# Patient Record
Sex: Female | Born: 2000 | Race: White | Hispanic: No | Marital: Single | Smoking: Never smoker
Health system: Southern US, Community
[De-identification: ages and names within clinical notes are randomized; demographics above are authoritative.]

## PROBLEM LIST (undated history)

## (undated) DIAGNOSIS — L709 Acne, unspecified: Secondary | ICD-10-CM

## (undated) DIAGNOSIS — N921 Excessive and frequent menstruation with irregular cycle: Secondary | ICD-10-CM

## (undated) HISTORY — DX: Excessive and frequent menstruation with irregular cycle: N92.1

## (undated) HISTORY — DX: Acne, unspecified: L70.9

## (undated) HISTORY — PX: WISDOM TOOTH EXTRACTION: SHX21

---
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
## 2022-05-18 ENCOUNTER — Telehealth: Payer: Self-pay

## 2022-05-18 NOTE — Telephone Encounter (Signed)
Patient had contacted office with concerns of UTI like symptoms that has been present <24hrs. In message patient stated that she no longer has health insurance and wanted to be prescribed medication without being seen. I contacted patient back who reported symptoms of urinary urgency and frequency. I advised patient that I could place her on nurse schedule so she would not be charged for office visit but I would need for her to come in to drop off a sample to be tested and sent for culture if needed before she is treated. Patient declined coming in office for nurse visit. Patient stated that if symptoms persisted over weekend she will go to urgent care or give our office a call back  on Monday. KW

## 2022-07-31 DIAGNOSIS — R112 Nausea with vomiting, unspecified: Secondary | ICD-10-CM | POA: Diagnosis not present

## 2022-08-01 DIAGNOSIS — R112 Nausea with vomiting, unspecified: Secondary | ICD-10-CM | POA: Diagnosis not present

## 2022-08-28 DIAGNOSIS — M9903 Segmental and somatic dysfunction of lumbar region: Secondary | ICD-10-CM | POA: Diagnosis not present

## 2022-08-28 DIAGNOSIS — M5451 Vertebrogenic low back pain: Secondary | ICD-10-CM | POA: Diagnosis not present

## 2022-08-28 DIAGNOSIS — M9905 Segmental and somatic dysfunction of pelvic region: Secondary | ICD-10-CM | POA: Diagnosis not present

## 2022-08-28 DIAGNOSIS — M9904 Segmental and somatic dysfunction of sacral region: Secondary | ICD-10-CM | POA: Diagnosis not present

## 2022-09-04 DIAGNOSIS — M5451 Vertebrogenic low back pain: Secondary | ICD-10-CM | POA: Diagnosis not present

## 2022-09-04 DIAGNOSIS — M9904 Segmental and somatic dysfunction of sacral region: Secondary | ICD-10-CM | POA: Diagnosis not present

## 2022-09-04 DIAGNOSIS — M9905 Segmental and somatic dysfunction of pelvic region: Secondary | ICD-10-CM | POA: Diagnosis not present

## 2022-09-04 DIAGNOSIS — M9903 Segmental and somatic dysfunction of lumbar region: Secondary | ICD-10-CM | POA: Diagnosis not present

## 2022-10-08 DIAGNOSIS — Z3202 Encounter for pregnancy test, result negative: Secondary | ICD-10-CM | POA: Diagnosis not present

## 2022-10-08 DIAGNOSIS — L700 Acne vulgaris: Secondary | ICD-10-CM | POA: Diagnosis not present

## 2022-10-08 NOTE — Progress Notes (Deleted)
    Pcp, No   No chief complaint on file.   HPI:      [name] is a 21 y.o. G0P0000 whose LMP was No LMP recorded. (Menstrual status: IUD)., presents today for ***  Hx of staph saprophyticus 4/22  Patient Active Problem List   Diagnosis Date Noted   Chronic UTI (urinary tract infection) 06/01/2021   Anxiety 11/26/2019   Menometrorrhagia 07/03/2018    Past Surgical History:  Procedure Laterality Date   WISDOM TOOTH EXTRACTION      Family History  Problem Relation Age of Onset   Pancreatic cancer Paternal Grandmother        41s   Colon cancer Paternal Grandmother     Social History   Socioeconomic History   Marital status: Single    Spouse name: Not on file   Number of children: Not on file   Years of education: Not on file   Highest education level: Not on file  Occupational History   Not on file  Tobacco Use   Smoking status: Never   Smokeless tobacco: Never  Vaping Use   Vaping Use: Some days  Substance and Sexual Activity   Alcohol use: Never   Drug use: Never   Sexual activity: Yes    Birth control/protection: I.U.D.    Comment: Kyleena  Other Topics Concern   Not on file  Social History Narrative   Not on file   Social Determinants of Health   Financial Resource Strain: Not on file  Food Insecurity: Not on file  Transportation Needs: Not on file  Physical Activity: Not on file  Stress: Not on file  Social Connections: Not on file  Intimate Partner Violence: Not on file    Outpatient Medications Prior to Visit  Medication Sig Dispense Refill   benzonatate (TESSALON) 100 MG capsule Take 1 capsule (100 mg total) by mouth 3 (three) times daily as needed for cough. 21 capsule 0   levonorgestrel (KYLEENA) 19.5 MG IUD 1 Intra Uterine Device (1 each total) by Intrauterine route once for 1 dose. 1 Intra Uterine Device 0   lidocaine (XYLOCAINE) 2 % solution Use as directed 15 mLs in the mouth or throat as needed for mouth pain. 100  mL 0   No facility-administered medications prior to visit.      ROS:  Review of Systems BREAST: No symptoms   OBJECTIVE:   Vitals:  There were no vitals taken for this visit.  Physical Exam  Results: No results found for this or any previous visit (from the past 24 hour(s)).   Assessment/Plan: No diagnosis found.    No orders of the defined types were placed in this encounter.     No follow-ups on file.  Jenai Scaletta B. Brett Soza, PA-C 10/08/2022 6:45 PM

## 2022-10-09 ENCOUNTER — Other Ambulatory Visit (HOSPITAL_COMMUNITY)
Admission: RE | Admit: 2022-10-09 | Discharge: 2022-10-09 | Disposition: A | Discharge status: Home / Self Care | Payer: BC Managed Care – PPO | Source: Ambulatory Visit | Attending: Obstetrics and Gynecology | Admitting: Obstetrics and Gynecology

## 2022-10-09 ENCOUNTER — Ambulatory Visit (INDEPENDENT_AMBULATORY_CARE_PROVIDER_SITE_OTHER): Payer: BC Managed Care – PPO | Admitting: Certified Nurse Midwife

## 2022-10-09 ENCOUNTER — Encounter: Payer: Self-pay | Admitting: Certified Nurse Midwife

## 2022-10-09 ENCOUNTER — Ambulatory Visit: Payer: BC Managed Care – PPO | Admitting: Obstetrics and Gynecology

## 2022-10-09 VITALS — BP 120/81 | HR 93 | Resp 16 | Wt 126.7 lb

## 2022-10-09 DIAGNOSIS — N898 Other specified noninflammatory disorders of vagina: Secondary | ICD-10-CM | POA: Insufficient documentation

## 2022-10-09 DIAGNOSIS — R300 Dysuria: Secondary | ICD-10-CM

## 2022-10-09 DIAGNOSIS — N912 Amenorrhea, unspecified: Secondary | ICD-10-CM

## 2022-10-09 DIAGNOSIS — Z3202 Encounter for pregnancy test, result negative: Secondary | ICD-10-CM | POA: Diagnosis not present

## 2022-10-09 DIAGNOSIS — Z113 Encounter for screening for infections with a predominantly sexual mode of transmission: Secondary | ICD-10-CM

## 2022-10-09 DIAGNOSIS — R399 Unspecified symptoms and signs involving the genitourinary system: Secondary | ICD-10-CM

## 2022-10-09 LAB — POCT URINALYSIS DIPSTICK
Bilirubin, UA: NEGATIVE
Blood, UA: NEGATIVE
Glucose, UA: NEGATIVE
Ketones, UA: NEGATIVE
Leukocytes, UA: NEGATIVE
Nitrite, UA: NEGATIVE
Protein, UA: NEGATIVE
Spec Grav, UA: 1.01 (ref 1.010–1.025)
Urobilinogen, UA: 0.2 E.U./dL
pH, UA: 7 (ref 5.0–8.0)

## 2022-10-09 LAB — POCT URINE PREGNANCY: Preg Test, Ur: NEGATIVE

## 2022-10-09 NOTE — Addendum Note (Signed)
Addended by: Fonda Kinder on: 10/09/2022 11:21 AM   Modules accepted: Orders

## 2022-10-09 NOTE — Addendum Note (Signed)
Addended by: Fonda Kinder on: 10/09/2022 10:14 AM   Modules accepted: Orders

## 2022-10-09 NOTE — Progress Notes (Signed)
Subjective:    Marissa Woods is a 21 y.o. female who complains of burning with urination and hesitancy for 2 days.  Patient also complains of  history of recurrent UTI . Patient denies congestion and fever.  Patient .  Patient does not have a history of pyelonephritis.  She also complains of change in discharge white milky with increased amount. She has had a new partner and request STD testing. She has concern about not having her cycle this month.   The following portions of the patient's history were reviewed and updated as appropriate: allergies, current medications, past family history, past medical history, past social history, past surgical history, and problem list. Review of Systems Pertinent items are noted in HPI.    Objective:    There were no vitals taken for this visit. General: alert, cooperative, appears stated age, and mild distress  Abdomen: soft, non-tender, without masses or organomegaly in the entire abdomen   .Normal cervix IUD strings seen. While/clear discharge, no odor   Back: CVA tenderness absent  GU: defer exam   Laboratory:  Urine dipstick shows negative for all components.  .    Assessment:    Increased vaginal discharge   Urinary hesitancy and burning Plan: Plan:   Reassurance given regarding missed menses given she has IUD in placed. Discussed this is a common side effect with IUD  1. Medications: not indicated at this time culture sent ,  2. Vaginal swab for STD/BV/yeast sent 2. Maintain adequate hydration 3. Follow up if symptoms not improving, and prn.   Will follow up with results.   Doreene Burke, CNM

## 2022-10-10 LAB — CERVICOVAGINAL ANCILLARY ONLY
Bacterial Vaginitis (gardnerella): NEGATIVE
Candida Glabrata: NEGATIVE
Candida Vaginitis: NEGATIVE
Chlamydia: NEGATIVE
Comment: NEGATIVE
Comment: NEGATIVE
Comment: NEGATIVE
Comment: NEGATIVE
Comment: NEGATIVE
Comment: NORMAL
Neisseria Gonorrhea: NEGATIVE
Trichomonas: NEGATIVE

## 2022-10-10 LAB — HEPATITIS C ANTIBODY: Hep C Virus Ab: NONREACTIVE

## 2022-10-10 LAB — RPR: RPR Ser Ql: NONREACTIVE

## 2022-10-10 LAB — HEPATITIS B SURFACE ANTIGEN: Hepatitis B Surface Ag: NEGATIVE

## 2022-10-12 LAB — URINE CULTURE

## 2022-10-15 ENCOUNTER — Encounter: Payer: Self-pay | Admitting: Certified Nurse Midwife

## 2022-10-15 ENCOUNTER — Other Ambulatory Visit: Payer: Self-pay | Admitting: Certified Nurse Midwife

## 2022-10-15 MED ORDER — NITROFURANTOIN MONOHYD MACRO 100 MG PO CAPS: 100.0000 mg | ORAL_CAPSULE | Freq: Two times a day (BID) | ORAL | 0 refills | Status: AC

## 2022-11-08 DIAGNOSIS — Z3202 Encounter for pregnancy test, result negative: Secondary | ICD-10-CM | POA: Diagnosis not present

## 2022-11-08 DIAGNOSIS — L700 Acne vulgaris: Secondary | ICD-10-CM | POA: Diagnosis not present

## 2022-12-06 ENCOUNTER — Ambulatory Visit: Payer: Self-pay | Admitting: Obstetrics and Gynecology

## 2023-02-13 ENCOUNTER — Encounter: Payer: Self-pay | Admitting: Obstetrics and Gynecology

## 2024-09-23 ENCOUNTER — Telehealth: Payer: Self-pay | Admitting: Certified Nurse Midwife

## 2024-09-23 NOTE — Telephone Encounter (Signed)
 Patient has an appointment scheduled(11/03/2024) with you for IUD removal and replacement.  She would like some medication for pain. Please advise the patient if this is something that can be done.  Marissa Woods

## 2024-10-30 MED ORDER — MISOPROSTOL 200 MCG PO TABS: ORAL_TABLET | ORAL | 0 refills | Status: DC

## 2024-10-30 MED ORDER — MISOPROSTOL 200 MCG PO TABS: ORAL_TABLET | ORAL | 2 refills | Status: DC

## 2024-11-02 NOTE — Patient Instructions (Signed)
 IUD PLACEMENT POST-PROCEDURE INSTRUCTIONS  You may take Ibuprofen, Aleve or Tylenol for pain if needed.  Cramping should resolve within in 24 hours.  You may have a small amount of spotting.  You should wear a mini pad for the next few days.  You may have intercourse after 24 hours.  If you using this for birth control, it is effective immediately.  You need to call if you have any pelvic pain, fever, heavy bleeding or foul smelling vaginal discharge.  Irregular bleeding is common the first several months after having an IUD placed. You do not need to call for this reason unless you are concerned.  Shower or bathe as normal  You should have a follow-up appointment in 4-8 weeks for a re-check to make sure you are not having any problems.    Hormonal Birth Control (Hormonal Contraception): What to Know Hormonal birth control, also called hormonal contraception, is a type of birth control that uses chemicals called hormones to prevent pregnancy. It may include a combination of the hormones estrogen and progesterone, or only the hormone progesterone. Hormonal birth control works in these ways: It thickens the mucus in the cervix, which is the lowest part of the uterus. Thicker mucus makes it harder for sperm to get into the uterus. It changes the lining of the uterus. This makes it harder for an egg to attach or implant. It may stop the ovaries from releasing eggs, called ovulation. Some people who take hormonal birth control that contains only progesterone may continue to ovulate. Hormonal birth control doesn't protect against sexually transmitted infections (STIs). Pregnancy may still happen. Types of hormonal birth control  Estrogen and progesterone birth control Birth control that use a combination of estrogen and progesterone is available as: Pills that come in different combinations of hormones. Pills must be taken at the same time each day. They can affect your period. You can get your  period monthly, once every 3 months, or not at all. A patch that is applied to the butt, belly, upper outer arm, or back. It's kept in place for 3 weeks. It's taken off for the last or fourth week of the menstrual cycle. A vaginal ring. The ring is placed in the vagina and left there for 3 weeks. It's then taken off for the last or fourth week of the cycle. Progesterone-only birth control Birth control that uses only progesterone is available as: Pills. These should be taken at the same time every day. This is very important to decrease the chance of pregnancy. Pills containing progestin-only are usually taken every day of the cycle. Other types of pills may have an inactive pill for the last 4 days of every cycle. Intrauterine device (IUD). This device is inserted through the vagina and cervix into the uterus. It's taken out or replaced every 3 to 8 years, depending on the type. It can be taken out sooner. Implant. A plastic rod is placed under the skin of the upper arm. It is taken out or replaced every 3 years. It can be taken out sooner. Shot, also called injection. The shot is given once every 12 to 14 weeks. Risks associated with hormonal birth control Estrogen and progesterone birth control can sometimes cause side effects, such as: Feeling like you may throw up. Headaches. Breast tenderness. Bleeding or spotting between menstrual cycles. High blood pressure. This is rare. Strokes, heart attacks, or blood clots. These are rare. Progesterone-only birth control can sometimes have side effects, such as: Feeling  like you may throw up. Headaches. Breast tenderness. Irregular menstrual bleeding. High blood pressure. This is rare. Talk to your health care provider about what side effects may mean for you. Questions to ask: What type of hormonal birth control is right for me? How long should I plan to use hormonal birth control? What are the side effects of the hormonal birth control method  I choose? How can I prevent STIs while using hormonal birth control? Where to find more information Ask your provider for more information and resources about hormonal birth control. You can also go to: U.S. Department of Health and [employer], Office on Women's Health: http://hoffman.com/ This information is not intended to replace advice given to you by your health care provider. Make sure you discuss any questions you have with your health care provider. Document Revised: 05/20/2023 Document Reviewed: 05/20/2023 Elsevier Patient Education  2024 [employer].

## 2024-11-02 NOTE — Progress Notes (Unsigned)
° ° °  GYNECOLOGY OFFICE PROCEDURE NOTE  Marissa Woods is a 23 y.o. G0P0000 here for Kyleena  IUD removal and reinsertion. No GYN concerns.  Last pap smear has never been done she is just now of age to have a pap smear.  IUD Removal and Reinsertion  Patient identified, informed consent performed, consent signed.   Discussed risks of irregular bleeding, cramping, infection, malpositioning or misplacement of the IUD outside the uterus which may require further procedures. Also discussed >99% contraception efficacy, increased risk of ectopic pregnancy with failure of method.   Emphasized that this did not protect against STIs, condoms recommended during all sexual encounters.Advised to use backup contraception for one week as the risk of pregnancy is higher during the transition period of removing an IUD and replacing it with another one. Time out was performed. Speculum placed in the vagina. The strings of the IUD were grasped and pulled using ring forceps. The IUD was successfully removed in its entirety. The cervix was cleaned with Betadine x 2 and grasped anteriorly with a single tooth tenaculum.  The new *** IUD insertion apparatus was used to sound the uterus to *** cm;  the IUD was then placed per manufacturer's recommendations. Strings trimmed to 3 cm. Tenaculum was removed, good hemostasis noted. Patient tolerated procedure well.   Patient was given post-procedure instructions.  She was reminded to have backup contraception for one week during this transition period between IUDs.  Patient was also asked to check IUD strings periodically and follow up in 4 weeks for IUD check.   Damien Parsley, CNM  [facility]

## 2024-11-03 ENCOUNTER — Ambulatory Visit: Payer: Self-pay | Admitting: Certified Nurse Midwife

## 2024-11-04 ENCOUNTER — Encounter: Payer: Self-pay | Admitting: Certified Nurse Midwife

## 2024-11-04 ENCOUNTER — Ambulatory Visit: Payer: Self-pay | Admitting: Certified Nurse Midwife

## 2024-11-04 ENCOUNTER — Other Ambulatory Visit (HOSPITAL_COMMUNITY)
Admission: RE | Admit: 2024-11-04 | Discharge: 2024-11-04 | Disposition: A | Source: Ambulatory Visit | Attending: Certified Nurse Midwife | Admitting: Certified Nurse Midwife

## 2024-11-04 VITALS — BP 119/75 | HR 107 | Resp 16 | Ht 64.0 in | Wt 119.7 lb

## 2024-11-04 DIAGNOSIS — Z30433 Encounter for removal and reinsertion of intrauterine contraceptive device: Secondary | ICD-10-CM

## 2024-11-04 DIAGNOSIS — Z30430 Encounter for insertion of intrauterine contraceptive device: Secondary | ICD-10-CM | POA: Diagnosis present

## 2024-11-04 MED ORDER — LEVONORGESTREL 19.5 MG IU IUD: INTRAUTERINE_SYSTEM | Freq: Once | INTRAUTERINE | Status: AC

## 2024-11-04 MED ORDER — ONDANSETRON 4 MG PO TBDP: 4.0000 mg | ORAL_TABLET | Freq: Three times a day (TID) | ORAL | 0 refills | Status: AC | PRN

## 2024-11-04 MED ORDER — OXYCODONE HCL 5 MG PO TABS: 5.0000 mg | ORAL_TABLET | ORAL | 0 refills | Status: AC | PRN

## 2024-11-09 LAB — CYTOLOGY - PAP: Diagnosis: NEGATIVE
